# Patient Record
Sex: Male | Born: 1965 | Race: White | Hispanic: No | Marital: Married | Smoking: Never smoker
Health system: Southern US, Community
[De-identification: ages and names within clinical notes are randomized; demographics above are authoritative.]

## PROBLEM LIST (undated history)

## (undated) DIAGNOSIS — I10 Essential (primary) hypertension: Secondary | ICD-10-CM

## (undated) HISTORY — PX: BACK SURGERY: SHX140

---

## 1998-11-07 ENCOUNTER — Ambulatory Visit (HOSPITAL_COMMUNITY): Admission: RE | Admit: 1998-11-07 | Discharge: 1998-11-07 | Payer: Self-pay | Admitting: Orthopedic Surgery

## 1998-11-07 ENCOUNTER — Encounter: Payer: Self-pay | Admitting: Orthopedic Surgery

## 1998-12-26 ENCOUNTER — Encounter: Payer: Self-pay | Admitting: Orthopedic Surgery

## 1998-12-26 ENCOUNTER — Ambulatory Visit (HOSPITAL_COMMUNITY): Admission: RE | Admit: 1998-12-26 | Discharge: 1998-12-26 | Payer: Self-pay | Admitting: Orthopedic Surgery

## 1999-01-09 ENCOUNTER — Ambulatory Visit (HOSPITAL_COMMUNITY): Admission: RE | Admit: 1999-01-09 | Discharge: 1999-01-09 | Payer: Self-pay | Admitting: Orthopedic Surgery

## 1999-01-09 ENCOUNTER — Encounter: Payer: Self-pay | Admitting: Orthopedic Surgery

## 2007-06-24 ENCOUNTER — Emergency Department (HOSPITAL_COMMUNITY): Admission: EM | Admit: 2007-06-24 | Discharge: 2007-06-24 | Payer: Self-pay | Admitting: Emergency Medicine

## 2011-04-21 ENCOUNTER — Other Ambulatory Visit: Payer: Self-pay | Admitting: Family Medicine

## 2011-04-21 ENCOUNTER — Ambulatory Visit
Admission: RE | Admit: 2011-04-21 | Discharge: 2011-04-21 | Disposition: A | Discharge status: Home / Self Care | Payer: BC Managed Care – PPO | Source: Ambulatory Visit | Attending: Family Medicine | Admitting: Family Medicine

## 2011-04-21 DIAGNOSIS — R109 Unspecified abdominal pain: Secondary | ICD-10-CM

## 2011-04-21 DIAGNOSIS — N23 Unspecified renal colic: Secondary | ICD-10-CM

## 2011-04-21 MED ORDER — IOHEXOL 300 MG/ML  SOLN
30.0000 mL | Freq: Once | INTRAMUSCULAR | Status: AC | PRN
  Administered 2011-04-21: 30 mL via ORAL

## 2011-04-21 MED ORDER — IOHEXOL 300 MG/ML  SOLN
100.0000 mL | Freq: Once | INTRAMUSCULAR | Status: AC | PRN
  Administered 2011-04-21: 100 mL via INTRAVENOUS

## 2012-10-03 IMAGING — CT CT ABD-PEL WO/W CM
2 of 5 series · 16 of 46 positions shown, 18 images · IV contrast (30CC OMNI 300 & [ID] OMNI 300)
Comparison: Noncontrast study earlier today.
COMPARISON: None.
COMPARISON: None.

<!--  IDXRADR:ADDEND:BEGIN -->Addendum Begins
<!--  IDXRADR:ADDEND:INNER_BEGIN -->***ADDENDUM*** CREATED: 04/21/2011 [DATE]

Following the noncontrast study, the study was repeated to with
oral and 100 ml Umnipaque-JQQ IV.
CT ABDOMEN AND PELVIS WITH CONTRAST
TECHNIQUE: Multidetector CT imaging of the abdomen and pelvis was
performed using the standard protocol following bolus
administration of intravenous contrast.
Contrast: 100mL OMNIPAQUE IOHEXOL 300 MG/ML IV SOLN, 30mL OMNIPAQUE
IOHEXOL 300 MG/ML IV SOLN
CLINICAL DATA: Abdominal pain, left flank pain.
CT ABDOMEN AND PELVIS WITHOUT CONTRAST
performed following the standard protocol without intravenous
contrast.

[Series 2: abd/pelvis with · axial · 0.70mm/px · z∈[-405,+5]mm · 13 of 90 slices shown, 15 images]
[im 5/90  soft-tissue]
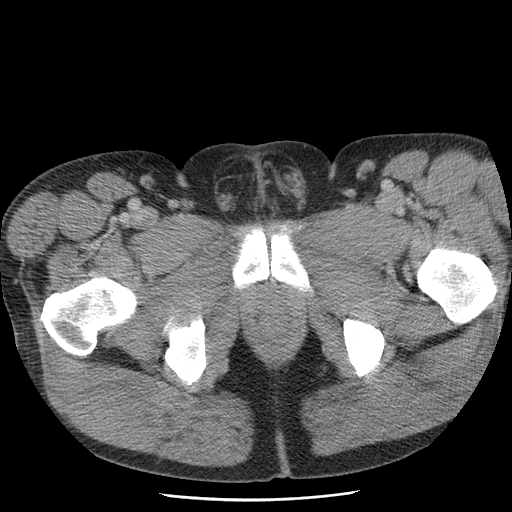
[im 5/90  bone]
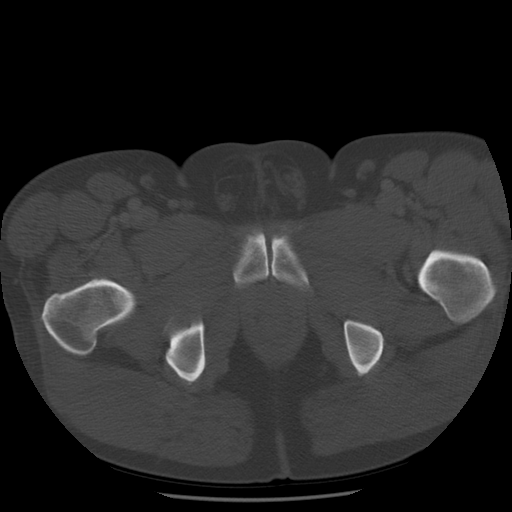
[im 15/90  soft-tissue]
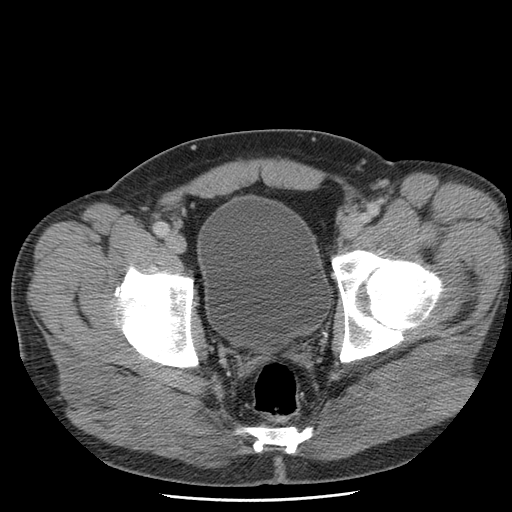
[im 19/90  soft-tissue]
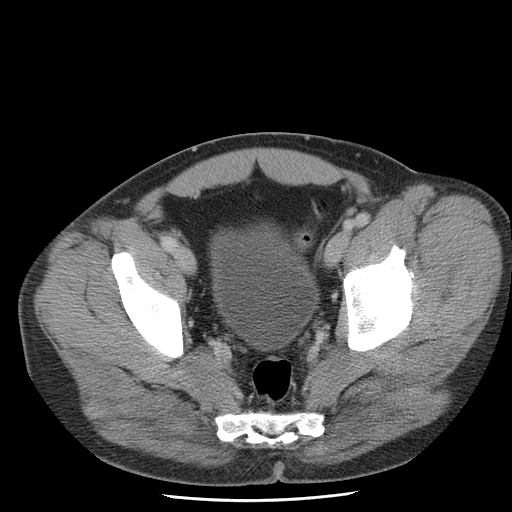
[im 24/90  soft-tissue]
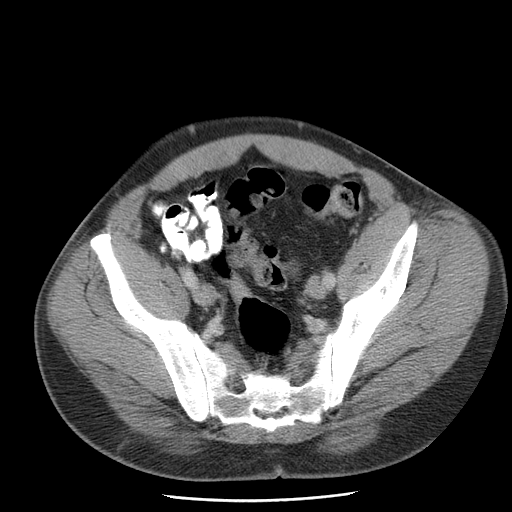
[im 33/90  soft-tissue]
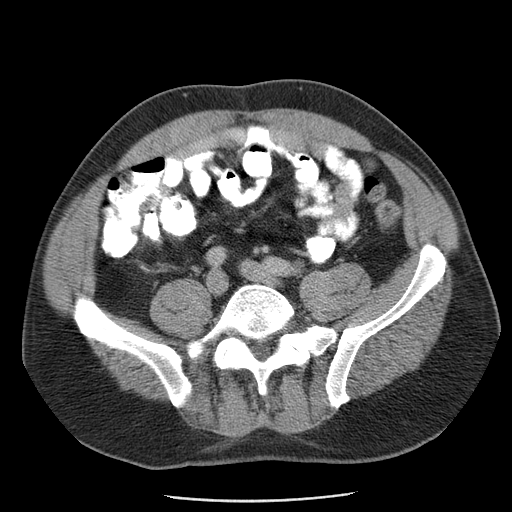
[im 38/90  soft-tissue]
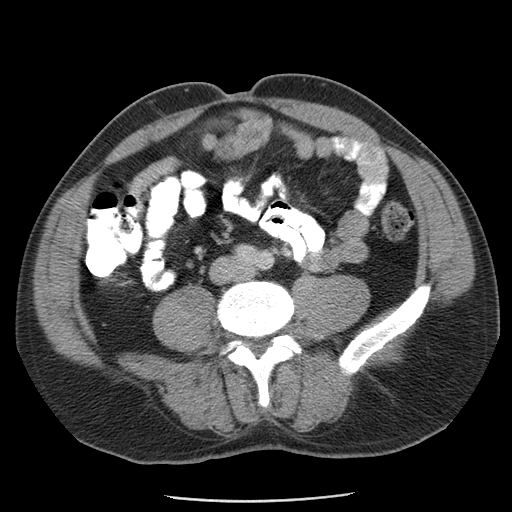
[im 47/90  soft-tissue]
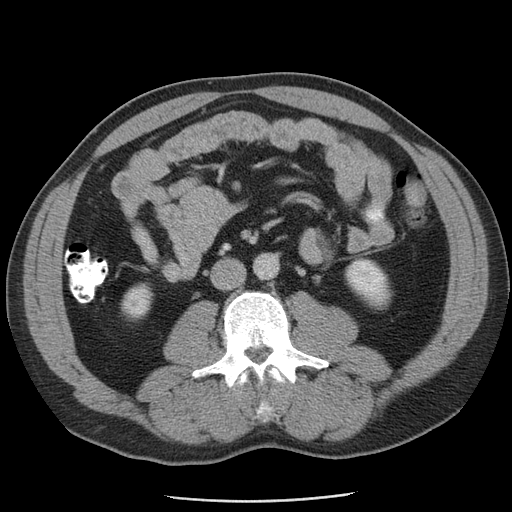
[im 52/90  soft-tissue]
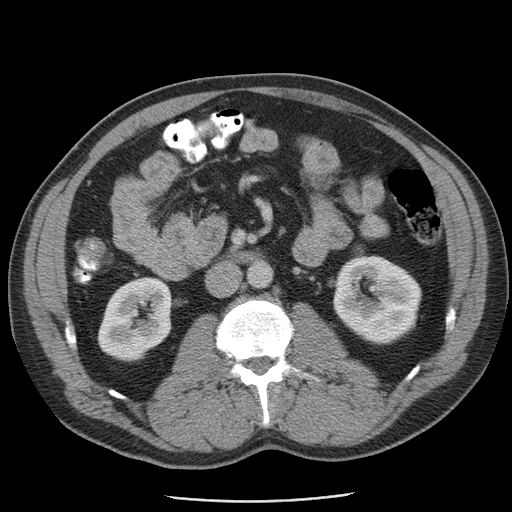
[im 57/90  soft-tissue]
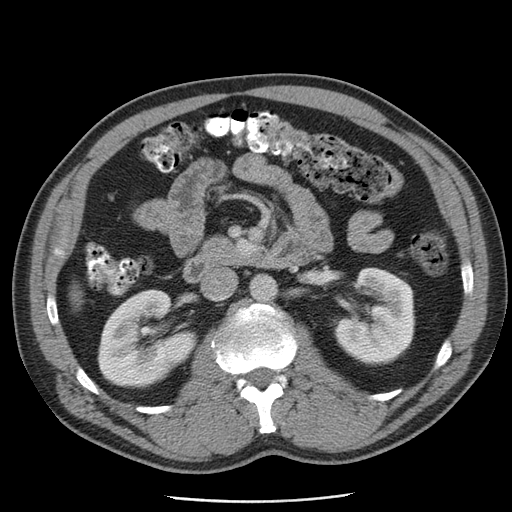
[im 57/90  bone]
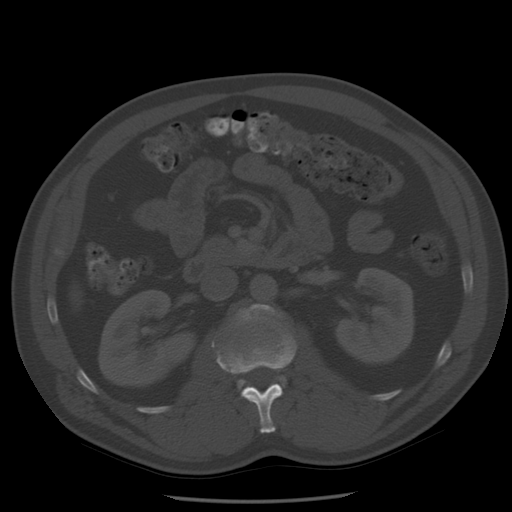
[im 66/90  soft-tissue]
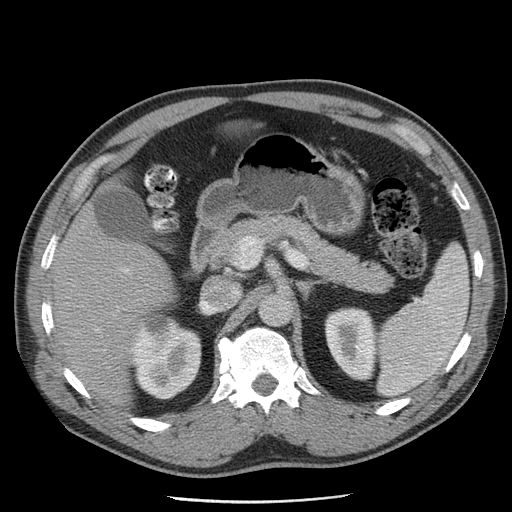
[im 71/90  soft-tissue]
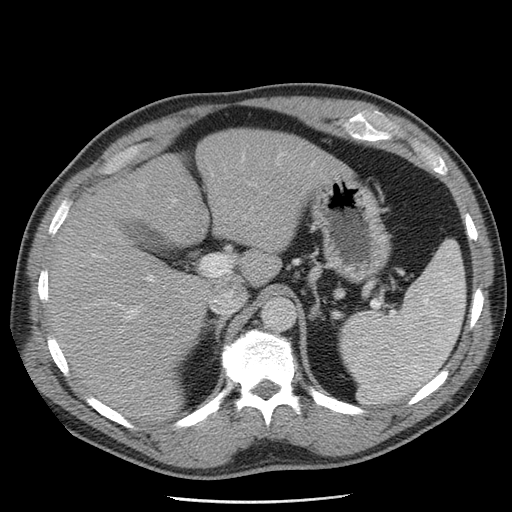
[im 75/90  soft-tissue]
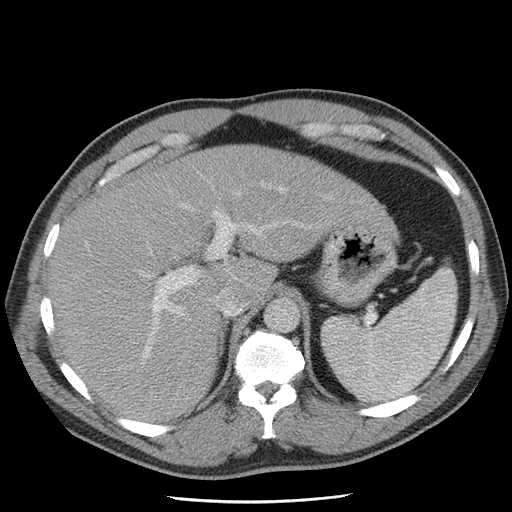
[im 85/90  soft-tissue]
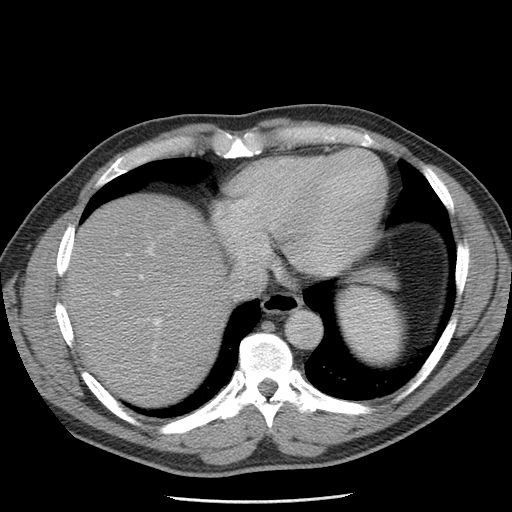

[Series 400: cor w/ cm · coronal · 0.98mm/px · 3 of 119 slices shown]
[im 40/119  soft-tissue]
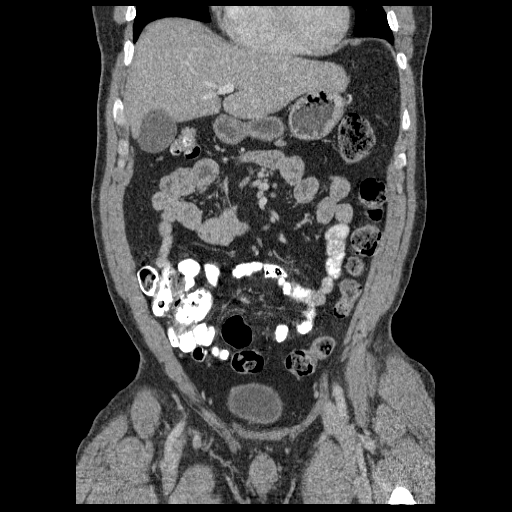
[im 53/119  soft-tissue]
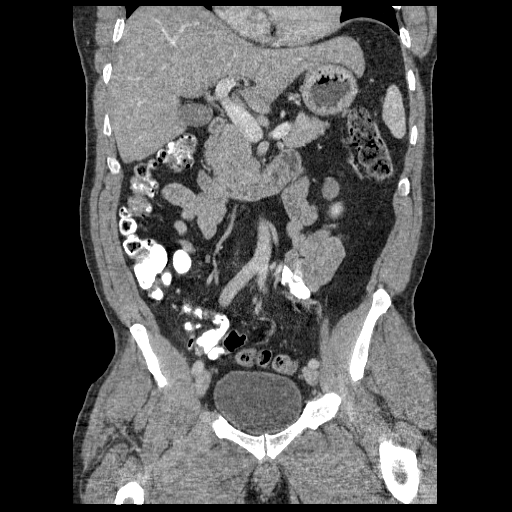
[im 66/119  soft-tissue]
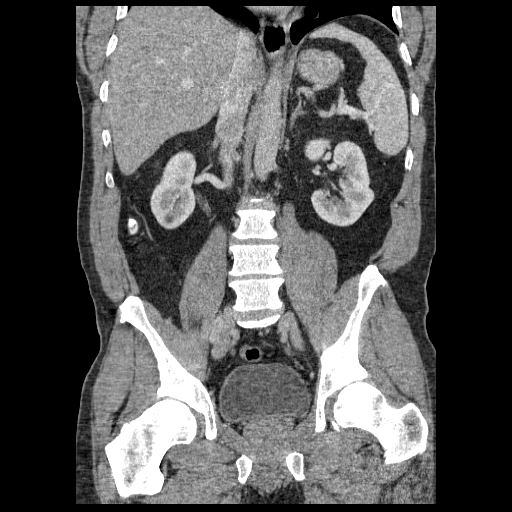

[16 of 46 positions shown; findings below may reference images not displayed]

FINDINGS: 2.1 cm benign-appearing cyst in the upper pole of the
right kidney.  Kidneys are otherwise unremarkable.

Liver, gallbladder, spleen, pancreas, adrenals have a normal
appearance.  Appendix is visualized and is normal. Bowel grossly
unremarkable.  No free fluid, free air, or adenopathy.  No acute
bony abnormality.
IMPRESSION: Simple appearing right renal cyst.

Otherwise unremarkable study.

No acute findings.

***END ADDENDUM*** SIGNED BY: Jaider David Naranja, M.D.
FINDINGS: Heart is upper limits normal in size.  Scattered coronary
artery calcifications noted.  Lung bases are clear.  No effusions.

Liver, gallbladder, spleen, pancreas, adrenals and kidneys have an
unremarkable unenhanced appearance.  No renal or ureteral stones.
No hydronephrosis.  Urinary bladder wall.  Calcified phleboliths in
the anatomic pelvis.

Appendix is visualized and is normal. Bowel grossly unremarkable.
No free fluid, free air, or adenopathy.  Aorta is normal caliber.

No acute bony abnormality.
IMPRESSION: No acute findings in the abdomen or pelvis.

<!--  IDXRADR:ADDEND:INNER_END -->Addendum Ends
<!--  IDXRADR:ADDEND:END -->*RADIOLOGY REPORT*
FINDINGS: Heart is upper limits normal in size.  Scattered coronary
artery calcifications noted.  Lung bases are clear.  No effusions.

Liver, gallbladder, spleen, pancreas, adrenals and kidneys have an
unremarkable unenhanced appearance.  No renal or ureteral stones.
No hydronephrosis.  Urinary bladder wall.  Calcified phleboliths in
the anatomic pelvis.

Appendix is visualized and is normal. Bowel grossly unremarkable.
No free fluid, free air, or adenopathy.  Aorta is normal caliber.

No acute bony abnormality.
IMPRESSION: No acute findings in the abdomen or pelvis.

## 2016-01-28 ENCOUNTER — Encounter (HOSPITAL_BASED_OUTPATIENT_CLINIC_OR_DEPARTMENT_OTHER): Payer: Self-pay

## 2016-01-28 ENCOUNTER — Emergency Department (HOSPITAL_BASED_OUTPATIENT_CLINIC_OR_DEPARTMENT_OTHER)
Admission: EM | Admit: 2016-01-28 | Discharge: 2016-01-28 | Disposition: A | Discharge status: Home / Self Care | Payer: BLUE CROSS/BLUE SHIELD | Attending: Emergency Medicine | Admitting: Emergency Medicine

## 2016-01-28 DIAGNOSIS — M79602 Pain in left arm: Secondary | ICD-10-CM | POA: Diagnosis not present

## 2016-01-28 DIAGNOSIS — Z79899 Other long term (current) drug therapy: Secondary | ICD-10-CM | POA: Insufficient documentation

## 2016-01-28 DIAGNOSIS — M79601 Pain in right arm: Secondary | ICD-10-CM | POA: Insufficient documentation

## 2016-01-28 DIAGNOSIS — I10 Essential (primary) hypertension: Secondary | ICD-10-CM | POA: Diagnosis not present

## 2016-01-28 DIAGNOSIS — M791 Myalgia, unspecified site: Secondary | ICD-10-CM

## 2016-01-28 HISTORY — DX: Essential (primary) hypertension: I10

## 2016-01-28 LAB — CBC WITH DIFFERENTIAL/PLATELET
BASOS ABS: 0.1 10*3/uL (ref 0.0–0.1)
BASOS PCT: 0 %
EOS PCT: 1 %
Eosinophils Absolute: 0.1 10*3/uL (ref 0.0–0.7)
HEMATOCRIT: 38.3 % — AB (ref 39.0–52.0)
Hemoglobin: 13.2 g/dL (ref 13.0–17.0)
Lymphocytes Relative: 14 %
Lymphs Abs: 1.6 10*3/uL (ref 0.7–4.0)
MCH: 29.6 pg (ref 26.0–34.0)
MCHC: 34.5 g/dL (ref 30.0–36.0)
MCV: 85.9 fL (ref 78.0–100.0)
MONO ABS: 1.2 10*3/uL — AB (ref 0.1–1.0)
MONOS PCT: 11 %
NEUTROS ABS: 8.2 10*3/uL — AB (ref 1.7–7.7)
Neutrophils Relative %: 74 %
PLATELETS: 360 10*3/uL (ref 150–400)
RBC: 4.46 MIL/uL (ref 4.22–5.81)
RDW: 12.5 % (ref 11.5–15.5)
WBC: 11.2 10*3/uL — ABNORMAL HIGH (ref 4.0–10.5)

## 2016-01-28 LAB — COMPREHENSIVE METABOLIC PANEL
ALBUMIN: 3.5 g/dL (ref 3.5–5.0)
ALT: 31 U/L (ref 17–63)
ANION GAP: 8 (ref 5–15)
AST: 20 U/L (ref 15–41)
Alkaline Phosphatase: 113 U/L (ref 38–126)
BILIRUBIN TOTAL: 0.7 mg/dL (ref 0.3–1.2)
BUN: 27 mg/dL — ABNORMAL HIGH (ref 6–20)
CHLORIDE: 97 mmol/L — AB (ref 101–111)
CO2: 26 mmol/L (ref 22–32)
Calcium: 9 mg/dL (ref 8.9–10.3)
Creatinine, Ser: 0.95 mg/dL (ref 0.61–1.24)
GFR calc Af Amer: >60 mL/min (ref >60)
Glucose, Bld: 180 mg/dL — ABNORMAL HIGH (ref 65–99)
POTASSIUM: 3.9 mmol/L (ref 3.5–5.1)
Sodium: 131 mmol/L — ABNORMAL LOW (ref 135–145)
TOTAL PROTEIN: 7.2 g/dL (ref 6.5–8.1)

## 2016-01-28 LAB — URINALYSIS, ROUTINE W REFLEX MICROSCOPIC
Bilirubin Urine: NEGATIVE
Glucose, UA: NEGATIVE mg/dL
Hgb urine dipstick: NEGATIVE
KETONES UR: NEGATIVE mg/dL
LEUKOCYTES UA: NEGATIVE
NITRITE: NEGATIVE
PROTEIN: 100 mg/dL — AB
Specific Gravity, Urine: 1.025 (ref 1.005–1.030)
pH: 6 (ref 5.0–8.0)

## 2016-01-28 LAB — URINE MICROSCOPIC-ADD ON
RBC / HPF: NONE SEEN RBC/hpf (ref 0–5)
WBC, UA: NONE SEEN WBC/hpf (ref 0–5)

## 2016-01-28 LAB — CK: CK TOTAL: 77 U/L (ref 49–397)

## 2016-01-28 LAB — SEDIMENTATION RATE: SED RATE: 42 mm/h — AB (ref 0–16)

## 2016-01-28 LAB — C-REACTIVE PROTEIN: CRP: 6.1 mg/dL — AB (ref <1.0)

## 2016-01-28 MED ORDER — METHYLPREDNISOLONE 4 MG PO TBPK: ORAL_TABLET | ORAL | 0 refills | Status: AC

## 2016-01-28 MED ORDER — SODIUM CHLORIDE 0.9 % IV BOLUS (SEPSIS)
1000.0000 mL | Freq: Once | INTRAVENOUS | Status: AC
  Administered 2016-01-28: 1000 mL via INTRAVENOUS

## 2016-01-28 MED ORDER — METHYLPREDNISOLONE SODIUM SUCC 125 MG IJ SOLR
125.0000 mg | Freq: Once | INTRAMUSCULAR | Status: AC
  Administered 2016-01-28: 125 mg via INTRAVENOUS
  Filled 2016-01-28: qty 2

## 2016-01-28 NOTE — ED Provider Notes (Signed)
[facility] Note   CSN: [id number] Arrival date & time: 01/28/16  1726 By signing my name below, I, Jeremiah Fitzgerald, attest that this documentation has been prepared under the direction and in the presence of Jeremiah FossaElizabeth Raeden Schippers, MD . Electronically Signed: Levon HedgerElizabeth Fitzgerald, Scribe. 01/28/2016. 7:28 PM.   History   Chief Complaint Chief Complaint  Patient presents with  . Muscle Pain   HPI Jeremiah ButtonJohnny G Mangal is a 50 y.o. male with hx of HTN who presents to the Emergency Department complaining of progressively worsening, waxing and waning pain to his bilateral shoulders and and hips with radiation into his thighs onset four weeks ago, but worse today. He describes his pain as throbbing and rates it 10/10 in severity. He states the pain is so severe that he can't get out of bed or close his car door. Per pt, he overexerted himself last night at work. Pain is worse in the mornings. He has taken aleve and applied icy hot and tiger balm with little relief. No family hx of autoimmune disease. He denies any cough, fever, SOB, vomiting, diarrhea, abdominal pain, night sweats, or weight changes.   The history is provided by the patient. No language interpreter was used.   Past Medical History:  Diagnosis Date  . Hypertension    There are no active problems to display for this patient.  Past Surgical History:  Procedure Laterality Date  . BACK SURGERY      Home Medications    Prior to Admission medications   Medication Sig Start Date End Date Taking? Authorizing Provider  amLODipine (NORVASC) 10 MG tablet Take 10 mg by mouth daily.   Yes Historical Provider, MD  carvedilol (COREG) 12.5 MG tablet Take 12.5 mg by mouth 2 (two) times daily with a meal.   Yes Historical Provider, MD  citalopram (CELEXA) 20 MG tablet Take 20 mg by mouth daily.   Yes Historical Provider, MD  lisinopril (PRINIVIL,ZESTRIL) 20 MG tablet Take 20 mg by mouth daily.   Yes Historical Provider, MD    methylPREDNISolone (MEDROL DOSEPAK) 4 MG TBPK tablet take according to label instructions 01/28/16   Jeremiah FossaElizabeth Oziah Vitanza, MD    Family History No family history on file.  Social History Social History  Substance Use Topics  . Smoking status: Never Smoker  . Smokeless tobacco: Never Used  . Alcohol use No    Allergies   Patient has no known allergies.  Review of Systems Review of Systems 10 systems reviewed and all are negative for acute change except as noted in the HPI.   Physical Exam Updated Vital Signs BP 112/75 (BP Location: Left Arm)   Pulse 72   Temp 98 F (36.7 C) (Oral)   Resp 18   Ht 5\' 7"  (1.702 m)   Wt 208 lb (94.3 kg)   SpO2 100%   BMI 32.58 kg/m   Physical Exam  Constitutional: He is oriented to person, place, and time. He appears well-developed and well-nourished.  HENT:  Head: Normocephalic and atraumatic.  Cardiovascular: Normal rate and regular rhythm.   No murmur heard. Pulmonary/Chest: Effort normal and breath sounds normal. No respiratory distress.  Abdominal: Soft. There is no tenderness. There is no rebound and no guarding.  Musculoskeletal: He exhibits tenderness. He exhibits no edema.  Tenderness over upper arms and thighs bilaterally; 2+ DP pulses bilaterally   Neurological: He is alert and oriented to person, place, and time.  Skin: Skin is warm and dry.  Psychiatric: He has  a normal mood and affect. His behavior is normal.  Nursing note and vitals reviewed.  ED Treatments / Results  DIAGNOSTIC STUDIES:  Oxygen Saturation is 100% on RA, normal by my interpretation.    COORDINATION OF CARE:  7:22 PM Discussed treatment plan with pt at bedside and pt agreed to plan.   Labs (all labs ordered are listed, but only abnormal results are displayed) Labs Reviewed  CBC WITH DIFFERENTIAL/PLATELET - Abnormal; Notable for the following:       Result Value   WBC 11.2 (*)    HCT 38.3 (*)    Neutro Abs 8.2 (*)    Monocytes Absolute 1.2 (*)     All other components within normal limits  COMPREHENSIVE METABOLIC PANEL - Abnormal; Notable for the following:    Sodium 131 (*)    Chloride 97 (*)    Glucose, Bld 180 (*)    BUN 27 (*)    All other components within normal limits  SEDIMENTATION RATE - Abnormal; Notable for the following:    Sed Rate 42 (*)    All other components within normal limits  URINALYSIS, ROUTINE W REFLEX MICROSCOPIC (NOT AT [facility]) - Abnormal; Notable for the following:    Color, Urine AMBER (*)    Protein, ur 100 (*)    All other components within normal limits  URINE MICROSCOPIC-ADD ON - Abnormal; Notable for the following:    Squamous Epithelial / LPF 0-5 (*)    Bacteria, UA RARE (*)    All other components within normal limits  CK  C-REACTIVE PROTEIN   EKG  EKG Interpretation None       Radiology No results found.  Procedures Procedures (including critical care time)  Medications Ordered in ED Medications  sodium chloride 0.9 % bolus 1,000 mL (0 mLs Intravenous Stopped 01/28/16 2103)  sodium chloride 0.9 % bolus 1,000 mL (0 mLs Intravenous Stopped 01/28/16 2103)  methylPREDNISolone sodium succinate (SOLU-MEDROL) 125 mg/2 mL injection 125 mg (125 mg Intravenous Given 01/28/16 1934)     Initial Impression / Assessment and Plan / ED Course  I have reviewed the triage vital signs and the nursing notes.  Pertinent labs & imaging results that were available during my care of the patient were reviewed by me and considered in my medical decision making (see chart for details).  Clinical Course    Patient here for evaluation of one month of progressive body aches that K predominantly over his shoulder/upper arms and hips or thighs. He is nontoxic appearing on examination with good strength and tone. No evidence of acute infectious process. CK normal, presentation not consistent with rhabdomyolysis. Concern for possible inflammatory myopathy given his symptoms and elevation and a CSR, he did feel  better after her steroids and IV fluid administration in the ED. Will treat with steroid burst with close PCP follow-up for repeat assessment.  Final Clinical Impressions(s) / ED Diagnoses   Final diagnoses:  Myalgia   New Prescriptions Discharge Medication List as of 01/28/2016  8:43 PM    START taking these medications   Details  methylPREDNISolone (MEDROL DOSEPAK) 4 MG TBPK tablet take according to label instructions, Print      I personally performed the services described in this documentation, which was scribed in my presence. The recorded information has been reviewed and is accurate.    Jeremiah FossaElizabeth Waylyn Tenbrink, MD 01/28/16 2211

## 2016-01-28 NOTE — ED Triage Notes (Signed)
Patient reports pain in bilateral arms and hips.  Reports for months this has been going on but getting worse.  Reports he is too weak to dress himself and too weak to open a car door.  Reports he couldn't dress himself this morning.  Reports he cant sleep.  Denies tick bite. Ambulatory in triage.
